# Patient Record
Sex: Female | Born: 1975 | Race: White | Hispanic: No | Smoking: Never smoker
Health system: Southern US, Community
[De-identification: ages and names within clinical notes are randomized; demographics above are authoritative.]

## PROBLEM LIST (undated history)

## (undated) DIAGNOSIS — R519 Headache, unspecified: Secondary | ICD-10-CM

## (undated) DIAGNOSIS — E785 Hyperlipidemia, unspecified: Secondary | ICD-10-CM

## (undated) DIAGNOSIS — K589 Irritable bowel syndrome without diarrhea: Secondary | ICD-10-CM

## (undated) DIAGNOSIS — S0300XA Dislocation of jaw, unspecified side, initial encounter: Secondary | ICD-10-CM

## (undated) DIAGNOSIS — D649 Anemia, unspecified: Secondary | ICD-10-CM

## (undated) DIAGNOSIS — J329 Chronic sinusitis, unspecified: Secondary | ICD-10-CM

## (undated) DIAGNOSIS — M1990 Unspecified osteoarthritis, unspecified site: Secondary | ICD-10-CM

## (undated) DIAGNOSIS — K219 Gastro-esophageal reflux disease without esophagitis: Secondary | ICD-10-CM

## (undated) HISTORY — PX: FRACTURE SURGERY: SHX138

## (undated) HISTORY — PX: COLON SURGERY: SHX602

## (undated) HISTORY — PX: APPENDECTOMY: SHX54

## (undated) HISTORY — PX: NASAL SEPTOPLASTY W/ TURBINOPLASTY: SHX2070

## (undated) HISTORY — PX: BACK SURGERY: SHX140

## (undated) HISTORY — PX: RHINOPLASTY: SHX2354

## (undated) HISTORY — PX: BREAST SURGERY: SHX581

## (undated) HISTORY — PX: CARPAL TUNNEL RELEASE: SHX101

## (undated) HISTORY — PX: ENDOMETRIAL ABLATION W/ NOVASURE: SUR434

---

## 2021-01-11 ENCOUNTER — Emergency Department (HOSPITAL_COMMUNITY): Payer: Self-pay

## 2021-01-11 ENCOUNTER — Encounter (HOSPITAL_COMMUNITY): Payer: Self-pay | Admitting: Emergency Medicine

## 2021-01-11 ENCOUNTER — Other Ambulatory Visit: Payer: Self-pay

## 2021-01-11 ENCOUNTER — Emergency Department (HOSPITAL_COMMUNITY)
Admission: EM | Admit: 2021-01-11 | Discharge: 2021-01-11 | Disposition: A | Discharge status: Home / Self Care | Payer: Self-pay | Attending: Emergency Medicine | Admitting: Emergency Medicine

## 2021-01-11 DIAGNOSIS — B09 Unspecified viral infection characterized by skin and mucous membrane lesions: Secondary | ICD-10-CM | POA: Insufficient documentation

## 2021-01-11 DIAGNOSIS — R930 Abnormal findings on diagnostic imaging of skull and head, not elsewhere classified: Secondary | ICD-10-CM

## 2021-01-11 DIAGNOSIS — R000 Tachycardia, unspecified: Secondary | ICD-10-CM | POA: Insufficient documentation

## 2021-01-11 DIAGNOSIS — J3489 Other specified disorders of nose and nasal sinuses: Secondary | ICD-10-CM | POA: Insufficient documentation

## 2021-01-11 DIAGNOSIS — Z20822 Contact with and (suspected) exposure to covid-19: Secondary | ICD-10-CM | POA: Insufficient documentation

## 2021-01-11 DIAGNOSIS — J101 Influenza due to other identified influenza virus with other respiratory manifestations: Secondary | ICD-10-CM | POA: Insufficient documentation

## 2021-01-11 DIAGNOSIS — R400 Somnolence: Secondary | ICD-10-CM | POA: Insufficient documentation

## 2021-01-11 DIAGNOSIS — R42 Dizziness and giddiness: Secondary | ICD-10-CM | POA: Insufficient documentation

## 2021-01-11 DIAGNOSIS — M542 Cervicalgia: Secondary | ICD-10-CM | POA: Insufficient documentation

## 2021-01-11 DIAGNOSIS — R410 Disorientation, unspecified: Secondary | ICD-10-CM | POA: Insufficient documentation

## 2021-01-11 DIAGNOSIS — L0201 Cutaneous abscess of face: Secondary | ICD-10-CM | POA: Insufficient documentation

## 2021-01-11 LAB — CBC WITH DIFFERENTIAL/PLATELET
Abs Immature Granulocytes: 0.07 10*3/uL (ref 0.00–0.07)
Basophils Absolute: 0 10*3/uL (ref 0.0–0.1)
Basophils Relative: 1 %
Eosinophils Absolute: 0.1 10*3/uL (ref 0.0–0.5)
Eosinophils Relative: 1 %
HCT: 49.1 % — ABNORMAL HIGH (ref 36.0–46.0)
Hemoglobin: 15.9 g/dL — ABNORMAL HIGH (ref 12.0–15.0)
Immature Granulocytes: 1 %
Lymphocytes Relative: 34 %
Lymphs Abs: 3 10*3/uL (ref 0.7–4.0)
MCH: 30.4 pg (ref 26.0–34.0)
MCHC: 32.4 g/dL (ref 30.0–36.0)
MCV: 93.9 fL (ref 80.0–100.0)
Monocytes Absolute: 0.6 10*3/uL (ref 0.1–1.0)
Monocytes Relative: 6 %
Neutro Abs: 5.1 10*3/uL (ref 1.7–7.7)
Neutrophils Relative %: 57 %
Platelets: 157 10*3/uL (ref 150–400)
RBC: 5.23 MIL/uL — ABNORMAL HIGH (ref 3.87–5.11)
RDW: 13.3 % (ref 11.5–15.5)
WBC: 8.9 10*3/uL (ref 4.0–10.5)
nRBC: 0 % (ref 0.0–0.2)

## 2021-01-11 LAB — PROTIME-INR
INR: 0.9 (ref 0.8–1.2)
Prothrombin Time: 12.5 seconds (ref 11.4–15.2)

## 2021-01-11 LAB — URINALYSIS, ROUTINE W REFLEX MICROSCOPIC
Bacteria, UA: NONE SEEN
Bilirubin Urine: NEGATIVE
Glucose, UA: NEGATIVE mg/dL
Hgb urine dipstick: NEGATIVE
Ketones, ur: NEGATIVE mg/dL
Nitrite: NEGATIVE
Protein, ur: NEGATIVE mg/dL
Specific Gravity, Urine: 1.015 (ref 1.005–1.030)
pH: 6 (ref 5.0–8.0)

## 2021-01-11 LAB — COMPREHENSIVE METABOLIC PANEL
ALT: 46 U/L — ABNORMAL HIGH (ref 0–44)
AST: 38 U/L (ref 15–41)
Albumin: 3.6 g/dL (ref 3.5–5.0)
Alkaline Phosphatase: 62 U/L (ref 38–126)
Anion gap: 10 (ref 5–15)
BUN: 14 mg/dL (ref 6–20)
CO2: 23 mmol/L (ref 22–32)
Calcium: 9.3 mg/dL (ref 8.9–10.3)
Chloride: 102 mmol/L (ref 98–111)
Creatinine, Ser: 0.57 mg/dL (ref 0.44–1.00)
GFR, Estimated: >60 mL/min (ref >60)
Glucose, Bld: 120 mg/dL — ABNORMAL HIGH (ref 70–99)
Potassium: 4.4 mmol/L (ref 3.5–5.1)
Sodium: 135 mmol/L (ref 135–145)
Total Bilirubin: 0.8 mg/dL (ref 0.3–1.2)
Total Protein: 7.3 g/dL (ref 6.5–8.1)

## 2021-01-11 LAB — RESP PANEL BY RT-PCR (FLU A&B, COVID) ARPGX2
Influenza A by PCR: POSITIVE — AB
Influenza B by PCR: NEGATIVE
SARS Coronavirus 2 by RT PCR: NEGATIVE

## 2021-01-11 LAB — I-STAT BETA HCG BLOOD, ED (MC, WL, AP ONLY): I-stat hCG, quantitative: <5 m[IU]/mL (ref <5)

## 2021-01-11 LAB — LACTIC ACID, PLASMA: Lactic Acid, Venous: 1.5 mmol/L (ref 0.5–1.9)

## 2021-01-11 LAB — APTT: aPTT: 28 seconds (ref 24–36)

## 2021-01-11 MED ORDER — MORPHINE SULFATE (PF) 4 MG/ML IV SOLN
4.0000 mg | Freq: Once | INTRAVENOUS | Status: AC
  Administered 2021-01-11: 4 mg via INTRAVENOUS
  Filled 2021-01-11: qty 1

## 2021-01-11 MED ORDER — LACTATED RINGERS IV BOLUS (SEPSIS)
1000.0000 mL | Freq: Once | INTRAVENOUS | Status: AC
  Administered 2021-01-11: 1000 mL via INTRAVENOUS

## 2021-01-11 MED ORDER — CLINDAMYCIN PHOSPHATE 600 MG/50ML IV SOLN
600.0000 mg | Freq: Once | INTRAVENOUS | Status: AC
  Administered 2021-01-11: 600 mg via INTRAVENOUS
  Filled 2021-01-11: qty 50

## 2021-01-11 MED ORDER — IOHEXOL 300 MG/ML  SOLN: 75.0000 mL | Freq: Once | INTRAMUSCULAR | Status: DC | PRN

## 2021-01-11 NOTE — ED Notes (Signed)
Advised Pt we will need a urine spec. Grayling Congress, NT

## 2021-01-11 NOTE — ED Triage Notes (Signed)
Patient reports left head abscess to neck. States seen for same when symptoms started in April. Reports failed PO antibiotics. C/o intermittent fever and swelling around eyes.

## 2021-01-11 NOTE — ED Notes (Signed)
I/O cath not needed. Pt was able to use BSC. Whitney Clarke, NT

## 2021-01-11 NOTE — Discharge Instructions (Signed)
You have been evaluated for your symptoms.  You test positive for influenza A.  Continue to take Tylenol or ibuprofen as needed for your symptoms.  Please call in nose and throat specialist tomorrow to set up a close follow-up appointment.  Your rash is likely related to a viral infection and should resolve without any specific treatment

## 2021-01-11 NOTE — ED Provider Notes (Signed)
[facility] Provider Note   CSN: [id number] Arrival date & time: 01/11/21  2013     History Chief Complaint  Patient presents with  . Abscess    Whitney Clarke is a 45 y.o. female.  The history is provided by the patient and medical records. No language interpreter was used.  Abscess    45 year old female presenting with concerns of an abscess.  Patient states since April 18th, she has had sinus problem.  She report having pain to the left side of her face, with abnormal foul smell from her nose, as well as having to blow her nose.  With greenish phlegm.  She endorsed having headache, and fever.  She mention being seen by a provider, was prescribed clindamycin which she took for 7 days but report no improvement.  She then was seen by another provider for her complaint.  States she had a CT scan that shows infection.  She is unable to tell me more about this infection but she was prescribed doxycycline as well as steroid.  She has been taking the medication with improvement but now symptoms persist.  She is complaining of headache fever light sensitivity persistent nasal discharge.  Endorsed feeling a lot of pressure in her face along with dizziness.  Pain also radiates down her left side of the neck.  She report feeling confused and drowsy.  She reported feeling concerning for bacterial meningitis.  No past medical history on file.  There are no problems to display for this patient.   History reviewed. No pertinent surgical history.   OB History   No obstetric history on file.     No family history on file.     Home Medications Prior to Admission medications   Not on File    Allergies    Patient has no allergy information on record.  Review of Systems   Review of Systems  All other systems reviewed and are negative.   Physical Exam Updated Vital Signs BP (!) 154/95 (BP Location: Right Arm)   Pulse (!) 126   Temp 98.5 F (36.9  C) (Oral)   Resp 18   SpO2 100%   Physical Exam Vitals and nursing note reviewed.  Constitutional:      General: She is not in acute distress.    Appearance: She is well-developed. She is obese.  HENT:     Head: Atraumatic.     Comments: Tenderness to percussion of maxillary sinus    Nose: Congestion present. No rhinorrhea.     Mouth/Throat:     Mouth: Mucous membranes are moist.     Pharynx: Oropharynx is clear.  Eyes:     Extraocular Movements: Extraocular movements intact.     Conjunctiva/sclera: Conjunctivae normal.     Pupils: Pupils are equal, round, and reactive to light.  Cardiovascular:     Rate and Rhythm: Tachycardia present.     Pulses: Normal pulses.     Heart sounds: Normal heart sounds.  Pulmonary:     Effort: Pulmonary effort is normal.     Breath sounds: Normal breath sounds.  Abdominal:     Tenderness: There is no abdominal tenderness.  Musculoskeletal:     Cervical back: Normal range of motion and neck supple. Tenderness (Tenderness left paracervical spinal muscle.) present. No rigidity.  Skin:    Findings: No rash.  Neurological:     Mental Status: She is alert and oriented to person, place, and time.     GCS:  GCS eye subscore is 4. GCS verbal subscore is 5. GCS motor subscore is 6.     Cranial Nerves: Cranial nerves are intact.     Sensory: Sensation is intact.     Motor: Motor function is intact.     Gait: Gait is intact.  Psychiatric:        Mood and Affect: Mood normal.     ED Results / Procedures / Treatments   Labs (all labs ordered are listed, but only abnormal results are displayed) Labs Reviewed  RESP PANEL BY RT-PCR (FLU A&B, COVID) ARPGX2 - Abnormal; Notable for the following components:      Result Value   Influenza A by PCR POSITIVE (*)    All other components within normal limits  COMPREHENSIVE METABOLIC PANEL - Abnormal; Notable for the following components:   Glucose, Bld 120 (*)    ALT 46 (*)    All other components within  normal limits  CBC WITH DIFFERENTIAL/PLATELET - Abnormal; Notable for the following components:   RBC 5.23 (*)    Hemoglobin 15.9 (*)    HCT 49.1 (*)    All other components within normal limits  CULTURE, BLOOD (SINGLE)  URINE CULTURE  LACTIC ACID, PLASMA  LACTIC ACID, PLASMA  URINALYSIS, ROUTINE W REFLEX MICROSCOPIC  PROTIME-INR  APTT  I-STAT BETA HCG BLOOD, ED ([facility], [facility], [facility] ONLY)    EKG EKG Interpretation  Date/Time:  Sunday Jan 11 2021 20:56:58 EDT Ventricular Rate:  113 PR Interval:  112 QRS Duration: 92 QT Interval:  311 QTC Calculation: 427 R Axis:   70 Text Interpretation: Sinus tachycardia Confirmed by Alvester Chou [phone]) on 01/11/2021 9:01:53 PM   Radiology DG Chest Port 1 View  Result Date: 01/11/2021 CLINICAL DATA:  Intermittent fever.  Possible sepsis. EXAM: PORTABLE CHEST 1 VIEW COMPARISON:  None. FINDINGS: The heart size and mediastinal contours are within normal limits. Both lungs are clear. The visualized skeletal structures are unremarkable. IMPRESSION: Normal examination. Electronically Signed   By: Beckie Salts M.D.   On: 01/11/2021 20:59    Procedures Procedures   Medications Ordered in ED Medications  iohexol (OMNIPAQUE) 300 MG/ML solution 75 mL (has no administration in time range)  lactated ringers bolus 1,000 mL (1,000 mLs Intravenous New Bag/Given 01/11/21 2140)  clindamycin (CLEOCIN) IVPB 600 mg (0 mg Intravenous Stopped 01/11/21 2214)  morphine 4 MG/ML injection 4 mg (4 mg Intravenous Given 01/11/21 2140)    ED Course  I have reviewed the triage vital signs and the nursing notes.  Pertinent labs & imaging results that were available during my care of the patient were reviewed by me and considered in my medical decision making (see chart for details).    MDM Rules/Calculators/A&P                          BP 117/82 (BP Location: Right Arm)   Pulse (!) 110   Temp 98.7 F (37.1 C) (Oral)   Resp 16   Ht 5\' 3"  (1.6 m)   Wt 81.6 kg   SpO2  100%   BMI 31.89 kg/m   Final Clinical Impression(s) / ED Diagnoses Final diagnoses:  Influenza A  Left maxillary sinus opacification  Viral exanthem    Rx / DC Orders ED Discharge Orders    None     8:44 PM Patient here with persistent sinus infection not improved despite being on 2 different antibiotic, clindamycin, and doxycycline.  She voiced concern  for bacterial meningitis due to headache.  Patient overall well-appearing without any nuchal rigidity to suggest meningitis.  She is afebrile however she is tachycardic with a heart rate of 126.  She does have sinus tenderness on percussion.  Will obtain advanced imaging, check labs, and initiate IV antibiotic as treatment for sinusitis  Recent Sinus CT done on 01/04/2021 through [facility].   IMPRESSION:  1. Moderate to severe left-sided chronic sinus disease as above.  2. No intracranial or intraorbital extension of sinus disease identified.    Electronically Signed by: Lambert Keto on 01/04/2021 9:43 PM  Narrative Performed by [id number]   INDICATION: Sinusitis   TECHNIQUE: CT SINUS W IV CONTRAST   CONTRAST: 80 mL Isovue-370   FINDINGS:   # Frontal sinuses: Moderate left frontal sinus mucosal thickening. Frontal recesses patent on the right.  # Ethmoid sinuses: Multiple left ethmoid air cells opacified with mucoid density.  # Maxillary sinuses: Complete opacification of the left maxillary sinus. Patent right ostiomeatal unit.  # Sphenoid sinuses: Clear bilaterally.  # Nasal cavity: Unremarkable  # Nasopharynx: Unremarkable  # Soft tissues: No significant abnormality seen.  # Orbits: Grossly normal.  # Visualized intracranial structures: No abnormality seen.  Procedure Note  Lambert Keto, MD - 01/04/2021  Formatting of this note might be different from the original.    INDICATION: Sinusitis   TECHNIQUE: CT SINUS W IV CONTRAST   CONTRAST: 80 mL Isovue-370   FINDINGS:   # Frontal sinuses:  Moderate left frontal sinus mucosal thickening. Frontal recesses patent on the right.  # Ethmoid sinuses: Multiple left ethmoid air cells opacified with mucoid density.  # Maxillary sinuses: Complete opacification of the left maxillary sinus. Patent right ostiomeatal unit.  # Sphenoid sinuses: Clear bilaterally.  # Nasal cavity: Unremarkable  # Nasopharynx: Unremarkable  # Soft tissues: No significant abnormality seen.  # Orbits: Grossly normal.  # Visualized intracranial structures: No abnormality seen.     IMPRESSION:  1. Moderate to severe left-sided chronic sinus disease as above.  2. No intracranial or intraorbital extension of sinus disease identified.    Electronically Signed by: Lambert Keto on 01/04/2021 9:43 PM  10:23 PM Appreciate consultation from oncall ENT Dr. Pollyann Kennedy who recommend pt to reach out to his office tomorrow for close f/u.    10:33 PM Patient test positive for influenza A.  The remainder of her labs are reassuring.  Tachycardia did improved with IV fluid and pain medication.  Plan to hold off on repeat CT scan.  Patient does have a rash for the past 2 days mildly itchy I suspect this is likely to be a viral exanthem.  Doubt allergic reaction.  Patient will follow-up closely with ear nose and throat for further care.   Fayrene Helper, PA-C 01/11/21 2246    Terald Sleeper, MD 01/11/21 [phone]

## 2021-01-13 LAB — URINE CULTURE

## 2021-01-17 LAB — CULTURE, BLOOD (SINGLE)
Culture: NO GROWTH
Special Requests: ADEQUATE

## 2021-02-11 ENCOUNTER — Other Ambulatory Visit: Payer: Self-pay

## 2021-02-11 ENCOUNTER — Encounter (HOSPITAL_BASED_OUTPATIENT_CLINIC_OR_DEPARTMENT_OTHER): Payer: Self-pay | Admitting: Otolaryngology

## 2021-02-17 NOTE — H&P (Signed)
HPI:   Whitney Clarke is a 45 y.o. female who presents as a new Patient.   Referring Provider: Self, A Referral  Chief complaint: Sinus problem.  HPI: She has had problems with sinus infection for about 4-5 weeks now. She has left-sided facial pain and swelling and pressure. She has foul-smelling discolored drainage from the left side of her nose. She had a round of oral clindamycin followed by CT of the sinuses which was about 2-1/2 weeks ago. I am not able to access the films but the report describes chronic left-sided pansinusitis. Since the clindamycin and since the CT scan she has been treated with doxycycline and has not really had any relief. She is complaining about the discharge but also has chronic postnasal drip. She also has heartburn. She drinks 48 ounces of caffeine containing soda it on a daily basis. She is a smoker. She had a cigar this morning. She has extensive dental disease. She had some sort of nasal surgery years ago in [place].  PMH/Meds/All/SocHx/FamHx/ROS:   Past Medical History:  Diagnosis Date   Headache   Past Surgical History:  Procedure Laterality Date   RHINOPLASTY   SEPTOPLASTY WITH TURBINATE REDUCTION RHINOPLASTY NON COVERED   SINUS SURGERY   No family history of bleeding disorders, wound healing problems or difficulty with anesthesia.   Social History   Socioeconomic History   Marital status: Single  Spouse name: Not on file   Number of children: Not on file   Years of education: Not on file   Highest education level: Not on file  Occupational History   Not on file  Tobacco Use   Smoking status: Current Some Day Smoker   Smokeless tobacco: Never Used  Substance and Sexual Activity   Alcohol use: Not on file   Drug use: Not on file   Sexual activity: Not on file  Other Topics Concern   Not on file  Social History Narrative   Not on file   Social Determinants of Health   Financial Resource Strain: Not on file  Food Insecurity: Not on  file  Transportation Needs: Not on file  Physical Activity: Not on file  Stress: Not on file  Social Connections: Not on file  Housing Stability: Not on file   Current Outpatient Medications:   ibuprofen (ADVIL,MOTRIN) 800 MG tablet, TAKE 1 TABLET BY MOUTH EVERY 8 HOURS AS NEEDED PAIN, Disp: , Rfl:   A complete ROS was performed with pertinent positives/negatives noted in the HPI. The remainder of the ROS are negative.   Physical Exam:   BP 146/87  Pulse 115  Ht 1.6 m (5\' 3" )  Wt 95.3 kg (210 lb)  BMI 37.20 kg/m   General: Healthy and alert, in no distress, breathing easily. Normal affect. In a pleasant mood. She has a little bit of hoarseness. Head: Normocephalic, atraumatic. No masses, or scars. Eyes: Pupils are equal, and reactive to light. Vision is grossly intact. No spontaneous or gaze nystagmus. Ears: Ear canals are clear. Tympanic membranes are intact, with normal landmarks and the middle ears are clear and healthy. Hearing: Grossly normal. Nose: Nasal cavities are clear with healthy mucosa, no polyps or exudate. Airways are patent. Face: No masses or scars, facial nerve function is symmetric. Opening click in her TMJs. Mild tenderness on the left. There is no swelling of the face. Oral Cavity: No mucosal abnormalities are noted. Tongue with normal mobility. Dentition in poor repair. Oropharynx: Tonsils are symmetric. There are no mucosal masses identified.  Tongue base appears normal and healthy. Larynx/Hypopharynx: deferred Chest: Deferred Neck: No palpable masses, no cervical adenopathy, no thyroid nodules or enlargement. Neuro: Cranial nerves II-XII with normal function. Balance: Normal gate. Other findings: none.  Independent Review of Additional Tests or Records:  none  Procedures:  none  Impression & Plans:  1. TMJ pain and dysfunction. Recommend she talk to her dentist about this. This may be contributing to her left facial pain and pressure.  2. Chronic  reflux secondary to smoking and excessive caffeine consumption.We discussed causes of reflux, including lifestyle and dietary factors. Recommend strict avoidance of all tobacco, caffeine, alcohol, chocolate and peppermint. A reflux handout with more detailed instructions was provided to the patient.   3. Chronic sinusitis. Recommend we repeat a CT scan today so that I can actually view the films but also to see if there has been any improvement since completing the last antibiotic.  4. Chronic smoking. Recommend strongly that she try to quit.  Susy Frizzle, MD   Electronically signed by: Susy Frizzle, MD 01/19/21 1447  Sinus CT reveals chronic complete pansinusitis on the left side only. Given these findings recommend endoscopic sinus surgery on the left side. She has not responded to aggressive medical therapy. This has been present for the last couple of months at least. Risks and benefits were discussed. All questions were answered.  While there is no external swelling today she has described in the past a couple of episodes with swelling around the left side of the face and the left eye. I have instructed her that if she develops any new swelling around the left eye or any high fevers or any other symptoms that do not seem easily explainable that she should contact us immediately and go to the emergency department at [facility].

## 2021-02-18 ENCOUNTER — Ambulatory Visit (HOSPITAL_BASED_OUTPATIENT_CLINIC_OR_DEPARTMENT_OTHER): Payer: Self-pay | Admitting: Anesthesiology

## 2021-02-18 ENCOUNTER — Ambulatory Visit (HOSPITAL_BASED_OUTPATIENT_CLINIC_OR_DEPARTMENT_OTHER)
Admission: RE | Admit: 2021-02-18 | Discharge: 2021-02-18 | Disposition: A | Discharge status: Home / Self Care | Payer: Self-pay | Attending: Otolaryngology | Admitting: Otolaryngology

## 2021-02-18 ENCOUNTER — Other Ambulatory Visit: Payer: Self-pay

## 2021-02-18 ENCOUNTER — Encounter (HOSPITAL_BASED_OUTPATIENT_CLINIC_OR_DEPARTMENT_OTHER): Payer: Self-pay | Admitting: Otolaryngology

## 2021-02-18 ENCOUNTER — Encounter (HOSPITAL_BASED_OUTPATIENT_CLINIC_OR_DEPARTMENT_OTHER)
Admission: RE | Disposition: A | Discharge status: Home / Self Care | Payer: Self-pay | Source: Home / Self Care | Attending: Otolaryngology

## 2021-02-18 DIAGNOSIS — F17290 Nicotine dependence, other tobacco product, uncomplicated: Secondary | ICD-10-CM | POA: Insufficient documentation

## 2021-02-18 DIAGNOSIS — K219 Gastro-esophageal reflux disease without esophagitis: Secondary | ICD-10-CM | POA: Insufficient documentation

## 2021-02-18 DIAGNOSIS — J329 Chronic sinusitis, unspecified: Secondary | ICD-10-CM | POA: Insufficient documentation

## 2021-02-18 HISTORY — DX: Chronic sinusitis, unspecified: J32.9

## 2021-02-18 HISTORY — DX: Anemia, unspecified: D64.9

## 2021-02-18 HISTORY — PX: NASAL SINUS SURGERY: SHX719

## 2021-02-18 HISTORY — DX: Dislocation of jaw, unspecified side, initial encounter: S03.00XA

## 2021-02-18 HISTORY — DX: Irritable bowel syndrome without diarrhea: K58.9

## 2021-02-18 HISTORY — DX: Unspecified osteoarthritis, unspecified site: M19.90

## 2021-02-18 HISTORY — DX: Hyperlipidemia, unspecified: E78.5

## 2021-02-18 HISTORY — DX: Gastro-esophageal reflux disease without esophagitis: K21.9

## 2021-02-18 HISTORY — DX: Headache, unspecified: R51.9

## 2021-02-18 SURGERY — SINUS SURGERY, ENDOSCOPIC
Anesthesia: General | Site: Nose | Laterality: Left

## 2021-02-18 MED ORDER — OXYCODONE HCL 5 MG PO TABS
ORAL_TABLET | ORAL | Status: AC
  Filled 2021-02-18: qty 1

## 2021-02-18 MED ORDER — ONDANSETRON HCL 4 MG/2ML IJ SOLN
INTRAMUSCULAR | Status: AC
  Filled 2021-02-18: qty 2

## 2021-02-18 MED ORDER — BUPIVACAINE HCL (PF) 0.25 % IJ SOLN
INTRAMUSCULAR | Status: AC
  Filled 2021-02-18: qty 60

## 2021-02-18 MED ORDER — SUGAMMADEX SODIUM 500 MG/5ML IV SOLN
INTRAVENOUS | Status: DC | PRN
  Administered 2021-02-18: 500 mg via INTRAVENOUS

## 2021-02-18 MED ORDER — LIDOCAINE-EPINEPHRINE 1 %-1:100000 IJ SOLN
INTRAMUSCULAR | Status: AC
  Filled 2021-02-18: qty 1

## 2021-02-18 MED ORDER — BUPIVACAINE HCL (PF) 0.5 % IJ SOLN
INTRAMUSCULAR | Status: AC
  Filled 2021-02-18: qty 60

## 2021-02-18 MED ORDER — PHENYLEPHRINE HCL (PRESSORS) 10 MG/ML IV SOLN
INTRAVENOUS | Status: DC | PRN
  Administered 2021-02-18: 80 ug via INTRAVENOUS

## 2021-02-18 MED ORDER — OXYMETAZOLINE HCL 0.05 % NA SOLN
NASAL | Status: AC
  Filled 2021-02-18: qty 60

## 2021-02-18 MED ORDER — LACTATED RINGERS IV SOLN: INTRAVENOUS | Status: DC

## 2021-02-18 MED ORDER — OXYCODONE HCL 5 MG/5ML PO SOLN: 5.0000 mg | Freq: Once | ORAL | Status: AC | PRN

## 2021-02-18 MED ORDER — OXYMETAZOLINE HCL 0.05 % NA SOLN
NASAL | Status: AC
  Filled 2021-02-18: qty 30

## 2021-02-18 MED ORDER — FENTANYL CITRATE (PF) 100 MCG/2ML IJ SOLN
INTRAMUSCULAR | Status: AC
  Filled 2021-02-18: qty 2

## 2021-02-18 MED ORDER — OXYCODONE HCL 5 MG PO TABS
5.0000 mg | ORAL_TABLET | Freq: Once | ORAL | Status: AC | PRN
  Administered 2021-02-18: 5 mg via ORAL

## 2021-02-18 MED ORDER — DEXAMETHASONE SODIUM PHOSPHATE 4 MG/ML IJ SOLN
INTRAMUSCULAR | Status: DC | PRN
  Administered 2021-02-18: 5 mg via INTRAVENOUS

## 2021-02-18 MED ORDER — BACITRACIN ZINC 500 UNIT/GM EX OINT
TOPICAL_OINTMENT | CUTANEOUS | Status: AC
  Filled 2021-02-18: qty 0.9

## 2021-02-18 MED ORDER — MIDAZOLAM HCL 2 MG/2ML IJ SOLN
INTRAMUSCULAR | Status: AC
  Filled 2021-02-18: qty 2

## 2021-02-18 MED ORDER — LIDOCAINE HCL (PF) 2 % IJ SOLN
INTRAMUSCULAR | Status: AC
  Filled 2021-02-18: qty 5

## 2021-02-18 MED ORDER — HYDROMORPHONE HCL 1 MG/ML IJ SOLN
INTRAMUSCULAR | Status: AC
  Filled 2021-02-18: qty 0.5

## 2021-02-18 MED ORDER — FENTANYL CITRATE (PF) 100 MCG/2ML IJ SOLN
INTRAMUSCULAR | Status: DC | PRN
  Administered 2021-02-18: 100 ug via INTRAVENOUS

## 2021-02-18 MED ORDER — HYDROMORPHONE HCL 1 MG/ML IJ SOLN
0.2500 mg | INTRAMUSCULAR | Status: DC | PRN
  Administered 2021-02-18: 0.5 mg via INTRAVENOUS

## 2021-02-18 MED ORDER — PROMETHAZINE HCL 25 MG/ML IJ SOLN: 6.2500 mg | INTRAMUSCULAR | Status: DC | PRN

## 2021-02-18 MED ORDER — ROCURONIUM BROMIDE 100 MG/10ML IV SOLN
INTRAVENOUS | Status: DC | PRN
  Administered 2021-02-18: 100 mg via INTRAVENOUS

## 2021-02-18 MED ORDER — MEPERIDINE HCL 25 MG/ML IJ SOLN: 6.2500 mg | INTRAMUSCULAR | Status: DC | PRN

## 2021-02-18 MED ORDER — OXYMETAZOLINE HCL 0.05 % NA SOLN
2.0000 | NASAL | Status: DC
  Administered 2021-02-18: 2 via NASAL

## 2021-02-18 MED ORDER — LIDOCAINE HCL (PF) 1 % IJ SOLN
INTRAMUSCULAR | Status: AC
  Filled 2021-02-18: qty 30

## 2021-02-18 MED ORDER — AMISULPRIDE (ANTIEMETIC) 5 MG/2ML IV SOLN: 10.0000 mg | Freq: Once | INTRAVENOUS | Status: DC | PRN

## 2021-02-18 MED ORDER — HYDROCODONE-ACETAMINOPHEN 7.5-325 MG PO TABS: 1.0000 | ORAL_TABLET | Freq: Four times a day (QID) | ORAL | 0 refills | Status: AC | PRN

## 2021-02-18 MED ORDER — EPINEPHRINE PF 1 MG/ML IJ SOLN
INTRAMUSCULAR | Status: AC
  Filled 2021-02-18: qty 2

## 2021-02-18 MED ORDER — DEXAMETHASONE SODIUM PHOSPHATE 10 MG/ML IJ SOLN
INTRAMUSCULAR | Status: AC
  Filled 2021-02-18: qty 1

## 2021-02-18 MED ORDER — PROPOFOL 10 MG/ML IV BOLUS
INTRAVENOUS | Status: DC | PRN
  Administered 2021-02-18: 200 mg via INTRAVENOUS

## 2021-02-18 MED ORDER — PROMETHAZINE HCL 25 MG RE SUPP: 25.0000 mg | Freq: Four times a day (QID) | RECTAL | 1 refills | Status: AC | PRN

## 2021-02-18 MED ORDER — MIDAZOLAM HCL 5 MG/5ML IJ SOLN
INTRAMUSCULAR | Status: DC | PRN
  Administered 2021-02-18: 2 mg via INTRAVENOUS

## 2021-02-18 MED ORDER — PROPOFOL 500 MG/50ML IV EMUL
INTRAVENOUS | Status: AC
  Filled 2021-02-18: qty 50

## 2021-02-18 MED ORDER — LIDOCAINE HCL (CARDIAC) PF 100 MG/5ML IV SOSY
PREFILLED_SYRINGE | INTRAVENOUS | Status: DC | PRN
  Administered 2021-02-18: 100 mg via INTRAVENOUS

## 2021-02-18 MED ORDER — BACITRACIN ZINC 500 UNIT/GM EX OINT
TOPICAL_OINTMENT | CUTANEOUS | Status: AC
  Filled 2021-02-18: qty 28.35

## 2021-02-18 MED ORDER — OXYMETAZOLINE HCL 0.05 % NA SOLN
NASAL | Status: DC | PRN
  Administered 2021-02-18: 1 via TOPICAL

## 2021-02-18 MED ORDER — DEXMEDETOMIDINE HCL 200 MCG/2ML IV SOLN
INTRAVENOUS | Status: DC | PRN
  Administered 2021-02-18: 4 ug via INTRAVENOUS
  Administered 2021-02-18 (×2): 8 ug via INTRAVENOUS

## 2021-02-18 MED ORDER — LIDOCAINE-EPINEPHRINE 1 %-1:100000 IJ SOLN
INTRAMUSCULAR | Status: DC | PRN
  Administered 2021-02-18: 4 mL

## 2021-02-18 MED ORDER — ONDANSETRON HCL 4 MG/2ML IJ SOLN
INTRAMUSCULAR | Status: DC | PRN
  Administered 2021-02-18: 4 mg via INTRAVENOUS

## 2021-02-18 SURGICAL SUPPLY — 40 items
ATTRACTOMAT 16X20 MAGNETIC DRP (DRAPES) IMPLANT
BLADE RAD40 ROTATE 4M 4 5PK (BLADE) IMPLANT
BLADE RAD60 ROTATE M4 4 5PK (BLADE) IMPLANT
BLADE TRICUT ROTATE M4 4 5PK (BLADE) IMPLANT
BUR HS RAD FRONTAL 3 (BURR) IMPLANT
CANISTER SUC SOCK COL 7IN (MISCELLANEOUS) ×2 IMPLANT
CANISTER SUCT 1200ML W/VALVE (MISCELLANEOUS) ×4 IMPLANT
CORD BIPOLAR FORCEPS 12FT (ELECTRODE) IMPLANT
COVER WAND RF STERILE (DRAPES) IMPLANT
DECANTER SPIKE VIAL GLASS SM (MISCELLANEOUS) IMPLANT
DRESSING NASAL KENNEDY 3.5X.9 (MISCELLANEOUS) IMPLANT
DRSG CURAD 3X16 NADH (PACKING) IMPLANT
DRSG NASAL KENNEDY 3.5X.9 (MISCELLANEOUS)
DRSG NASAL KENNEDY LMNT 8CM (GAUZE/BANDAGES/DRESSINGS) IMPLANT
DRSG NASOPORE 8CM (GAUZE/BANDAGES/DRESSINGS) ×2 IMPLANT
DRSG TELFA 3X8 NADH (GAUZE/BANDAGES/DRESSINGS) IMPLANT
FORCEPS BIPOLAR SPETZLER 8 1.0 (NEUROSURGERY SUPPLIES) IMPLANT
GAUZE 4X4 16PLY RFD (DISPOSABLE) IMPLANT
GAUZE VASELINE FOILPK 1/2 X 72 (GAUZE/BANDAGES/DRESSINGS) IMPLANT
GLOVE SURG LTX SZ6.5 (GLOVE) ×2 IMPLANT
GLOVE SURG LTX SZ7.5 (GLOVE) ×2 IMPLANT
GLOVE SURG UNDER POLY LF SZ7 (GLOVE) ×2 IMPLANT
GOWN STRL REUS W/ TWL LRG LVL3 (GOWN DISPOSABLE) ×2 IMPLANT
GOWN STRL REUS W/TWL LRG LVL3 (GOWN DISPOSABLE) ×4
HEMOSTAT SURGICEL .5X2 ABSORB (HEMOSTASIS) IMPLANT
IV NS 500ML (IV SOLUTION) ×2
IV NS 500ML BAXH (IV SOLUTION) ×1 IMPLANT
NEEDLE PRECISIONGLIDE 27X1.5 (NEEDLE) ×2 IMPLANT
NEEDLE SPNL 25GX3.5 QUINCKE BL (NEEDLE) IMPLANT
NS IRRIG 1000ML POUR BTL (IV SOLUTION) ×2 IMPLANT
PACK BASIN DAY SURGERY FS (CUSTOM PROCEDURE TRAY) ×2 IMPLANT
PACK ENT DAY SURGERY (CUSTOM PROCEDURE TRAY) ×2 IMPLANT
PATTIES SURGICAL .5 X3 (DISPOSABLE) ×2 IMPLANT
SLEEVE SCD COMPRESS KNEE MED (STOCKING) ×2 IMPLANT
SOLUTION BUTLER CLEAR DIP (MISCELLANEOUS) ×2 IMPLANT
SPONGE GAUZE 2X2 8PLY STRL LF (GAUZE/BANDAGES/DRESSINGS) ×2 IMPLANT
SPONGE SURGIFOAM ABS GEL 12-7 (HEMOSTASIS) IMPLANT
TOWEL GREEN STERILE FF (TOWEL DISPOSABLE) ×2 IMPLANT
TUBE CONNECTING 20X1/4 (TUBING) ×2 IMPLANT
YANKAUER SUCT BULB TIP NO VENT (SUCTIONS) ×2 IMPLANT

## 2021-02-18 NOTE — Anesthesia Procedure Notes (Signed)
Procedure Name: Intubation Date/Time: 02/18/2021 9:13 AM Performed by: Maryella Shivers, CRNA Pre-anesthesia Checklist: Patient identified, Emergency Drugs available, Suction available and Patient being monitored Patient Re-evaluated:Patient Re-evaluated prior to induction Oxygen Delivery Method: Circle system utilized Preoxygenation: Pre-oxygenation with 100% oxygen Induction Type: IV induction Ventilation: Mask ventilation without difficulty Laryngoscope Size: Mac and 3 Grade View: Grade I Tube type: Oral Rae Tube size: 7.0 mm Number of attempts: 1 Airway Equipment and Method: Stylet and Oral airway Placement Confirmation: ETT inserted through vocal cords under direct vision, positive ETCO2 and breath sounds checked- equal and bilateral Secured at: 20 cm Tube secured with: Tape Dental Injury: Teeth and Oropharynx as per pre-operative assessment

## 2021-02-18 NOTE — Interval H&P Note (Signed)
History and Physical Interval Note:  02/18/2021 8:19 AM  Whitney Clarke  has presented today for surgery, with the diagnosis of Chronic Sinusitis.  The various methods of treatment have been discussed with the patient and family. After consideration of risks, benefits and other options for treatment, the patient has consented to  Procedure(s): ENDOSCOPIC SINUS SURGERY (Left) as a surgical intervention.  The patient's history has been reviewed, patient examined, no change in status, stable for surgery.  I have reviewed the patient's chart and labs.  Questions were answered to the patient's satisfaction.     Serena Colonel

## 2021-02-18 NOTE — Op Note (Addendum)
OPERATIVE REPORT  DATE OF SURGERY: 02/18/2021  PATIENT:  Whitney Clarke,  44 y.o. female  PRE-OPERATIVE DIAGNOSIS:  Chronic Sinusitis  POST-OPERATIVE DIAGNOSIS:  Chronic Sinusitis  PROCEDURE:  Procedure(s): ENDOSCOPIC MAXILLARY, ETHMOID AND FRONTAL SINUS SURGERY  SURGEON:  Susy Frizzle, MD  ASSISTANTS: None  ANESTHESIA:   General   EBL:  DRAINS: None  LOCAL MEDICATIONS USED: 1% Xylocaine with epinephrine  SPECIMEN:  Left nasal and sinus contents  COUNTS:  Correct  PROCEDURE DETAILS: The patient was taken to the operating room and placed on the operating table in the supine position. Following induction of general endotracheal anesthesia, the face was draped in a standard fashion.  Oxymetazoline spray was used preoperatively in the nasal cavities.  Using a 0 degree endoscope the middle turbinate was infiltrated with local anesthetic solution in the superior and posterior attachments, and the lateral nasal wall.  1.  Left endoscopic total ethmoidectomy.  Using combination of 0 and 30 degree nasal endoscopes the ethmoidectomy was completed.  Initially the uncinate was removed using a sickle knife.  The debrider was then used to open the bulla and enter into the anterior ethmoid cells.  There was diffuse polypoid degeneration within all of the ethmoid cells.  The ground lamella was taken down exposing posterior cells which were also filled with purulent secretions and polypoid tissue.  The lamina papyracea was the lateral limit of dissection was kept intact.  The superior limit of dissection was the fovea ethmoidalis which was also kept intact.  The frontal recess was filled with polypoid disease as well.  2.  Left endoscopic maxillary antrostomy with removal of tissue.  After the initial bolus opening was completed a curved suction and 30 degree endoscope was used to identify the fontanelle and purulent secretions were followed to enter into the maxillary antrum.  Antrostomy was  enlarged anteriorly with backbiting forceps and posteriorly using the microdebrider.  The maxillary sinus was filled with polypoid disease which was removed using the suction.  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  The sinus was completely cleaned of all of this tissue.  3.  Left endoscopic frontal sinusotomy.  After the ethmoid dissection was completed the frontal recess was then inspected using the 30 degree endoscope and a curved suction.  Giraffe forceps were used to remove the diseased tissue around the frontal recess and up into the frontal sinus.  The suction was then passed all the way up into the frontal sinus and all of the abnormal tissue was suctioned out.  The nasofrontal duct was felt to be of adequate size.  Periodically throughout the case Afrin-soaked pledgets were used for hemostasis.  At the termination of the procedure a nasal pore was cut to about three-quarter size and placed into the ethmoid cavity.  It was soaked in Afrin prior to placement.  The nasal cavity and pharynx were suctioned of blood and secretions.  Patient was awakened extubated and transferred to recovery in stable condition.    PATIENT DISPOSITION:  To PACU, stable

## 2021-02-18 NOTE — Transfer of Care (Signed)
Immediate Anesthesia Transfer of Care Note  Patient: Whitney Clarke  Procedure(s) Performed: ENDOSCOPIC MAXILLARY, ETHMOID AND FRONTAL SINUS SURGERY (Left: Nose)  Patient Location: PACU  Anesthesia Type:General  Level of Consciousness: sedated  Airway & Oxygen Therapy: Patient Spontanous Breathing and Patient connected to face mask oxygen  Post-op Assessment: Report given to RN and Post -op Vital signs reviewed and stable  Post vital signs: Reviewed and stable  Last Vitals:  Vitals Value Taken Time  BP 130/77 02/18/21 1008  Temp    Pulse 88 02/18/21 1012  Resp 23 02/18/21 1012  SpO2 97 % 02/18/21 1012  Vitals shown include unvalidated device data.  Last Pain:  Vitals:   02/18/21 0738  TempSrc: Oral  PainSc: 2          Complications: No notable events documented.

## 2021-02-18 NOTE — Anesthesia Postprocedure Evaluation (Signed)
Anesthesia Post Note  Patient: Whitney Clarke  Procedure(s) Performed: ENDOSCOPIC MAXILLARY, ETHMOID AND FRONTAL SINUS SURGERY (Left: Nose)     Patient location during evaluation: PACU Anesthesia Type: General Level of consciousness: awake and alert Pain management: pain level controlled Vital Signs Assessment: post-procedure vital signs reviewed and stable Respiratory status: spontaneous breathing, nonlabored ventilation and respiratory function stable Cardiovascular status: blood pressure returned to baseline and stable Postop Assessment: no apparent nausea or vomiting Anesthetic complications: no   No notable events documented.  Last Vitals:  Vitals:   02/18/21 1100 02/18/21 1112  BP: (!) 162/94 (!) 156/97  Pulse: 88 90  Resp: 15 16  Temp:  36.6 C  SpO2: 95% 98%    Last Pain:  Vitals:   02/18/21 1116  TempSrc:   PainSc: 8                  Lowella Curb

## 2021-02-18 NOTE — Discharge Instructions (Addendum)
Use nasal saline spray on the left side of the nose hourly while awake.   Post Anesthesia Home Care Instructions  Activity: Get plenty of rest for the remainder of the day. A responsible individual must stay with you for 24 hours following the procedure.  For the next 24 hours, DO NOT: -Drive a car -Advertising copywriter -Drink alcoholic beverages -Take any medication unless instructed by your physician -Make any legal decisions or sign important papers.  Meals: Start with liquid foods such as gelatin or soup. Progress to regular foods as tolerated. Avoid greasy, spicy, heavy foods. If nausea and/or vomiting occur, drink only clear liquids until the nausea and/or vomiting subsides. Call your physician if vomiting continues.  Special Instructions/Symptoms: Your throat may feel dry or sore from the anesthesia or the breathing tube placed in your throat during surgery. If this causes discomfort, gargle with warm salt water. The discomfort should disappear within 24 hours.  If you had a scopolamine patch placed behind your ear for the management of post- operative nausea and/or vomiting:  1. The medication in the patch is effective for 72 hours, after which it should be removed.  Wrap patch in a tissue and discard in the trash. Wash hands thoroughly with soap and water. 2. You may remove the patch earlier than 72 hours if you experience unpleasant side effects which may include dry mouth, dizziness or visual disturbances. 3. Avoid touching the patch. Wash your hands with soap and water after contact with the patch.

## 2021-02-18 NOTE — Anesthesia Preprocedure Evaluation (Signed)
Anesthesia Evaluation  Patient identified by MRN, date of birth, ID band Patient awake    Reviewed: Allergy & Precautions, NPO status , Patient's Chart, lab work & pertinent test results  Airway Mallampati: II  TM Distance: >3 FB Neck ROM: Full    Dental no notable dental hx.    Pulmonary neg pulmonary ROS,    Pulmonary exam normal breath sounds clear to auscultation       Cardiovascular negative cardio ROS Normal cardiovascular exam Rhythm:Regular Rate:Normal     Neuro/Psych  Headaches, negative psych ROS   GI/Hepatic Neg liver ROS, GERD  ,  Endo/Other  negative endocrine ROS  Renal/GU negative Renal ROS  negative genitourinary   Musculoskeletal  (+) Arthritis , Osteoarthritis,    Abdominal (+) + obese,   Peds negative pediatric ROS (+)  Hematology negative hematology ROS (+)   Anesthesia Other Findings   Reproductive/Obstetrics negative OB ROS                             Anesthesia Physical Anesthesia Plan  ASA: 2  Anesthesia Plan: General   Post-op Pain Management:    Induction: Intravenous  PONV Risk Score and Plan: 3 and Ondansetron, Dexamethasone, Midazolam and Treatment may vary due to age or medical condition  Airway Management Planned: Oral ETT  Additional Equipment:   Intra-op Plan:   Post-operative Plan: Extubation in OR  Informed Consent: I have reviewed the patients History and Physical, chart, labs and discussed the procedure including the risks, benefits and alternatives for the proposed anesthesia with the patient or authorized representative who has indicated his/her understanding and acceptance.     Dental advisory given  Plan Discussed with: CRNA  Anesthesia Plan Comments:         Anesthesia Quick Evaluation

## 2021-02-19 ENCOUNTER — Encounter (HOSPITAL_BASED_OUTPATIENT_CLINIC_OR_DEPARTMENT_OTHER): Payer: Self-pay | Admitting: Otolaryngology

## 2021-02-19 LAB — SURGICAL PATHOLOGY

## 2022-07-08 IMAGING — DX DG CHEST 1V PORT
1 series · 1 of 1 positions shown · non-contrast
Comparison: None.

CLINICAL DATA: Intermittent fever.  Possible sepsis.

EXAM:
PORTABLE CHEST 1 VIEW

[chest ap]
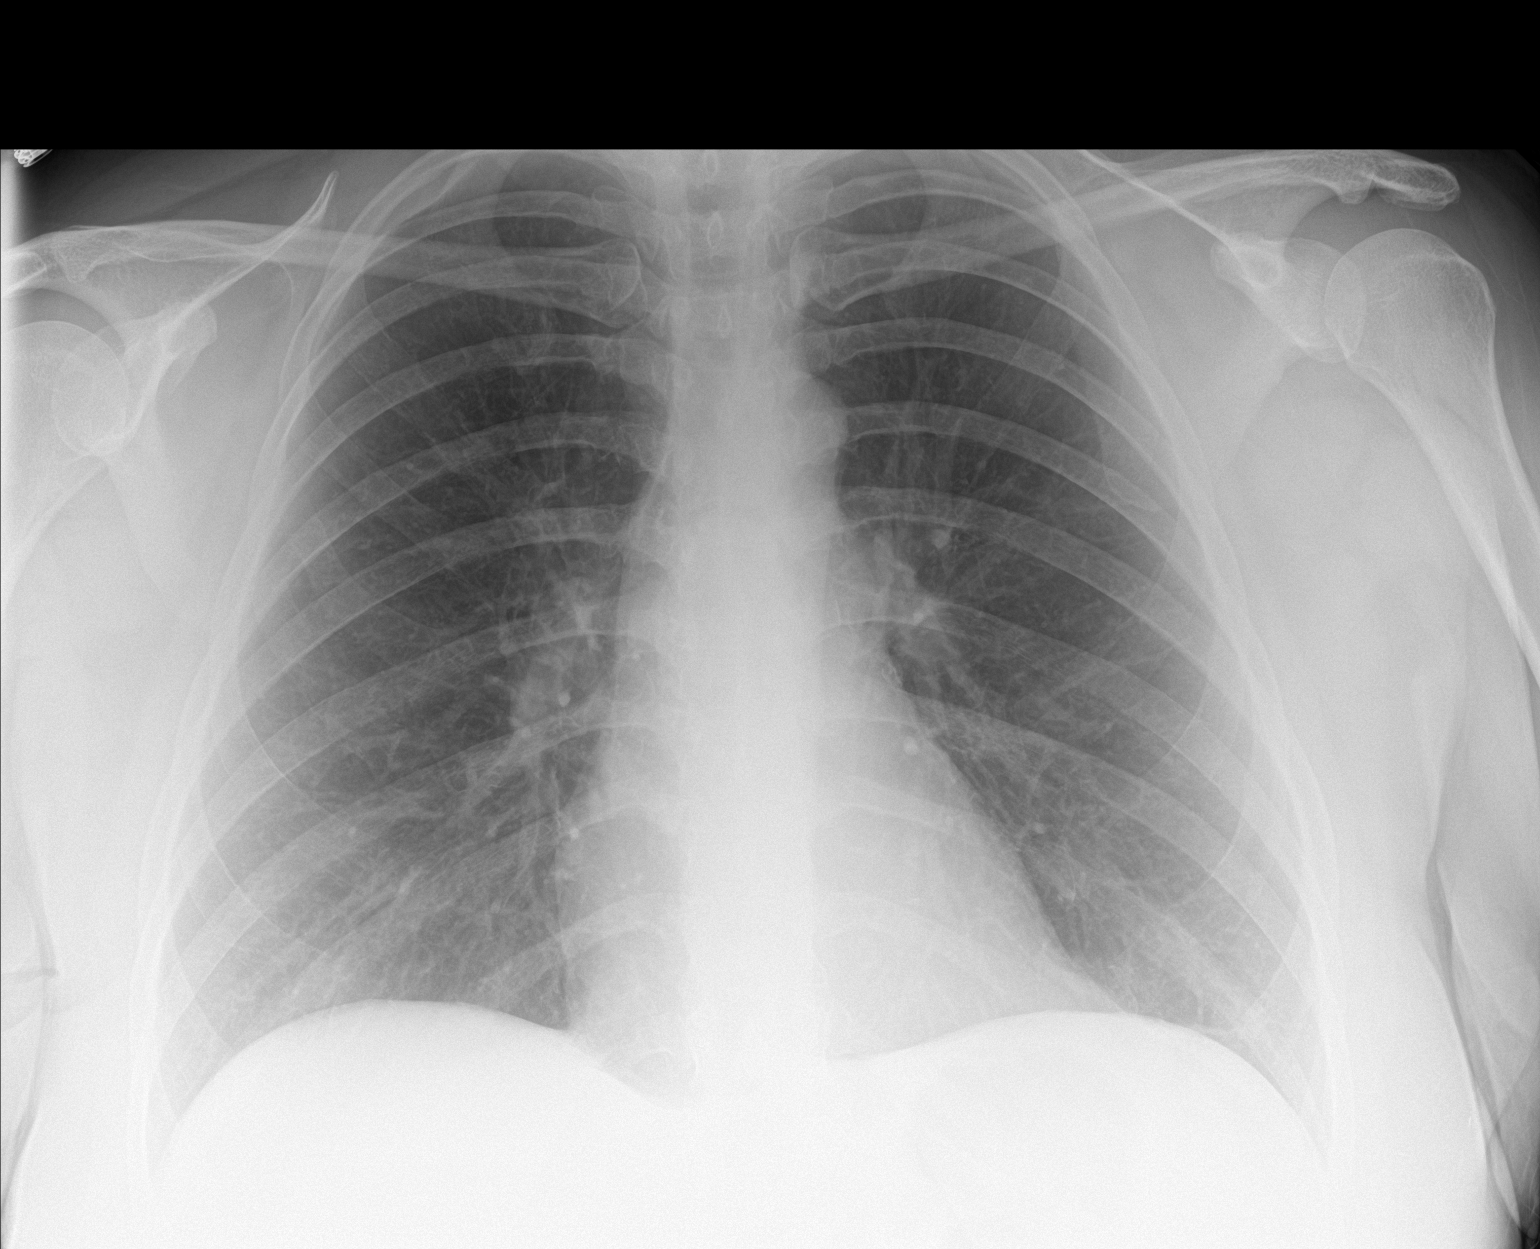

[1 of 1 positions shown; findings below may reference images not displayed]

FINDINGS: The heart size and mediastinal contours are within normal limits.
Both lungs are clear. The visualized skeletal structures are
unremarkable.
IMPRESSION: Normal examination.
